# Patient Record
Sex: Female | Born: 2000 | Race: White | Hispanic: No | Marital: Single
Health system: Southern US, Community
[De-identification: ages and names within clinical notes are randomized; demographics above are authoritative.]

## PROBLEM LIST (undated history)

## (undated) DIAGNOSIS — G44009 Cluster headache syndrome, unspecified, not intractable: Secondary | ICD-10-CM

---
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
## 2020-12-03 ENCOUNTER — Emergency Department: Payer: Managed Care, Other (non HMO)

## 2020-12-03 ENCOUNTER — Emergency Department
Admission: EM | Admit: 2020-12-03 | Discharge: 2020-12-03 | Disposition: A | Discharge status: Home / Self Care | Payer: Managed Care, Other (non HMO) | Attending: Emergency Medicine | Admitting: Emergency Medicine

## 2020-12-03 ENCOUNTER — Other Ambulatory Visit: Payer: Self-pay

## 2020-12-03 DIAGNOSIS — R4781 Slurred speech: Secondary | ICD-10-CM | POA: Diagnosis not present

## 2020-12-03 DIAGNOSIS — R251 Tremor, unspecified: Secondary | ICD-10-CM | POA: Insufficient documentation

## 2020-12-03 DIAGNOSIS — G43909 Migraine, unspecified, not intractable, without status migrainosus: Secondary | ICD-10-CM | POA: Diagnosis not present

## 2020-12-03 HISTORY — DX: Cluster headache syndrome, unspecified, not intractable: G44.009

## 2020-12-03 LAB — COMPREHENSIVE METABOLIC PANEL
ALT: 13 U/L (ref 0–44)
AST: 17 U/L (ref 15–41)
Albumin: 4.3 g/dL (ref 3.5–5.0)
Alkaline Phosphatase: 60 U/L (ref 38–126)
Anion gap: 11 (ref 5–15)
BUN: 9 mg/dL (ref 6–20)
CO2: 22 mmol/L (ref 22–32)
Calcium: 9.3 mg/dL (ref 8.9–10.3)
Chloride: 105 mmol/L (ref 98–111)
Creatinine, Ser: 0.49 mg/dL (ref 0.44–1.00)
GFR, Estimated: >60 mL/min (ref >60)
Glucose, Bld: 90 mg/dL (ref 70–99)
Potassium: 3.9 mmol/L (ref 3.5–5.1)
Sodium: 138 mmol/L (ref 135–145)
Total Bilirubin: 0.7 mg/dL (ref 0.3–1.2)
Total Protein: 7.9 g/dL (ref 6.5–8.1)

## 2020-12-03 LAB — CBC
HCT: 39.2 % (ref 36.0–46.0)
Hemoglobin: 13.1 g/dL (ref 12.0–15.0)
MCH: 31.6 pg (ref 26.0–34.0)
MCHC: 33.4 g/dL (ref 30.0–36.0)
MCV: 94.7 fL (ref 80.0–100.0)
Platelets: 267 10*3/uL (ref 150–400)
RBC: 4.14 MIL/uL (ref 3.87–5.11)
RDW: 12.1 % (ref 11.5–15.5)
WBC: 7.3 10*3/uL (ref 4.0–10.5)
nRBC: 0 % (ref 0.0–0.2)

## 2020-12-03 LAB — PROTIME-INR
INR: 0.9 (ref 0.8–1.2)
Prothrombin Time: 12.2 seconds (ref 11.4–15.2)

## 2020-12-03 LAB — DIFFERENTIAL
Abs Immature Granulocytes: 0.03 10*3/uL (ref 0.00–0.07)
Basophils Absolute: 0.1 10*3/uL (ref 0.0–0.1)
Basophils Relative: 1 %
Eosinophils Absolute: 0.1 10*3/uL (ref 0.0–0.5)
Eosinophils Relative: 1 %
Immature Granulocytes: 0 %
Lymphocytes Relative: 16 %
Lymphs Abs: 1.2 10*3/uL (ref 0.7–4.0)
Monocytes Absolute: 0.7 10*3/uL (ref 0.1–1.0)
Monocytes Relative: 9 %
Neutro Abs: 5.3 10*3/uL (ref 1.7–7.7)
Neutrophils Relative %: 73 %

## 2020-12-03 LAB — POC URINE PREG, ED: Preg Test, Ur: NEGATIVE

## 2020-12-03 LAB — APTT: aPTT: 25 seconds (ref 24–36)

## 2020-12-03 LAB — CBG MONITORING, ED: Glucose-Capillary: 78 mg/dL (ref 70–99)

## 2020-12-03 MED ORDER — PROCHLORPERAZINE EDISYLATE 10 MG/2ML IJ SOLN
10.0000 mg | Freq: Once | INTRAMUSCULAR | Status: AC
  Administered 2020-12-03: 10 mg via INTRAVENOUS
  Filled 2020-12-03: qty 2

## 2020-12-03 MED ORDER — DIPHENHYDRAMINE HCL 50 MG/ML IJ SOLN
25.0000 mg | Freq: Once | INTRAMUSCULAR | Status: AC
  Administered 2020-12-03: 25 mg via INTRAVENOUS
  Filled 2020-12-03: qty 1

## 2020-12-03 MED ORDER — LORAZEPAM 2 MG/ML IJ SOLN: 1.0000 mg | INTRAMUSCULAR | Status: DC | PRN

## 2020-12-03 NOTE — ED Notes (Signed)
Pt screened by MRI via phone

## 2020-12-03 NOTE — ED Triage Notes (Signed)
Pt is here to rule out a CVA. Pt has had a hx of compound migraines. Pt states that her symptoms started around 1230 and she was unable to speak or text. Pt right arm shaking uncontrollably in triage. Pt having trouble speaking.

## 2020-12-03 NOTE — ED Provider Notes (Signed)
[facility] Emergency Department Provider Note   ____________________________________________   Event Date/Time   First MD Initiated Contact with Patient 12/03/20 1457     (approximate)  I have reviewed the triage vital signs and the nursing notes.   HISTORY  Chief Complaint Migraine    HPI Alisha Vega is a 20 y.o. female with past medical history of complex migraines who presents to the ED complaining of speech difficulties.  Patient reports that she has been dealing with intermittent headaches for the past couple of days and today around 1230 she developed some difficulty speaking.  She complains of stuttering speech as well as shaking in her right arm.  She also reports intermittent blurry vision along with photophobia, neither of which are present currently.  She has not noticed any facial droop and denies any numbness or weakness in her extremities.  She states she has had neurologic issues with migraines in the past, including back in 2016 when she required MRI.  She does not take any medication chronically for migraine control, reports she had not dealt with them in multiple years since starting birth control.  She has otherwise been feeling well with no fevers, cough, chest pain, shortness of breath, vomiting, or diarrhea.        Past Medical History:  Diagnosis Date  . Migraine-cluster headache syndrome     There are no problems to display for this patient.   History reviewed. No pertinent surgical history.  Prior to Admission medications   Medication Sig Start Date End Date Taking? Authorizing Provider  LO LOESTRIN FE 1 MG-10 MCG / 10 MCG tablet Take 1 tablet by mouth daily. 11/27/18   [provider]    Allergies Patient has no known allergies.  No family history on file.  Social History    Review of Systems  Constitutional: No fever/chills Eyes: Positive for visual changes. ENT: No sore throat. Cardiovascular: Denies  chest pain. Respiratory: Denies shortness of breath. Gastrointestinal: No abdominal pain.  No nausea, no vomiting.  No diarrhea.  No constipation. Genitourinary: Negative for dysuria. Musculoskeletal: Negative for back pain. Skin: Negative for rash. Neurological: Positive for headaches and speech difficulty, negative for focal weakness or numbness.  ____________________________________________   PHYSICAL EXAM:  VITAL SIGNS: ED Triage Vitals  Enc Vitals Group     BP 12/03/20 1409 (!) 139/92     Pulse Rate 12/03/20 1409 94     Resp 12/03/20 1409 18     Temp 12/03/20 1409 99.3 F (37.4 C)     Temp Source 12/03/20 1409 Oral     SpO2 12/03/20 1409 99 %     Weight 12/03/20 1410 140 lb (63.5 kg)     Height 12/03/20 1410 5\' 7"  (1.702 m)     Head Circumference --      Peak Flow --      Pain Score 12/03/20 1410 0     Pain Loc --      Pain Edu? --      Excl. in GC? --     Constitutional: Alert and oriented. Eyes: Conjunctivae are normal.  Pupils equal round and reactive to light bilaterally, extraocular movements intact. Head: Atraumatic. Nose: No congestion/rhinnorhea. Mouth/Throat: Mucous membranes are moist. Neck: Normal ROM Cardiovascular: Normal rate, regular rhythm. Grossly normal heart sounds. Respiratory: Normal respiratory effort.  No retractions. Lungs CTAB. Gastrointestinal: Soft and nontender. No distention. Genitourinary: deferred Musculoskeletal: No lower extremity tenderness nor edema. Neurologic: Stuttering speech pattern with no word  finding difficulties.  Repetitive shaking in right forearm and wrist that cessates with activity, no gross focal neurologic deficits are appreciated. Skin:  Skin is warm, dry and intact. No rash noted. Psychiatric: Mood and affect are normal. Speech and behavior are normal.  ____________________________________________   LABS (all labs ordered are listed, but only abnormal results are displayed)  Labs Reviewed  PROTIME-INR   APTT  CBC  DIFFERENTIAL  COMPREHENSIVE METABOLIC PANEL  CBG MONITORING, ED  POC URINE PREG, ED   ____________________________________________  EKG  ED ECG REPORT I, Chesley Noon, the attending physician, personally viewed and interpreted this ECG.   Date: 12/03/2020  EKG Time: 15:32  Rate: 70  Rhythm: normal sinus rhythm  Axis: Normal  Intervals:none  ST&T Change: None   PROCEDURES  Procedure(s) performed (including Critical Care):  Procedures   ____________________________________________   INITIAL IMPRESSION / ASSESSMENT AND PLAN / ED COURSE       20 year old female with past medical history of complex migraines who presents to the ED with stuttering speech, right arm shaking, and intermittent visual changes since around 1230 this afternoon.  She reports waxing and waning headache over the past couple of days.  Neurologic exam does not appear consistent with stroke and case discussed with Dr. Derry Lory of neurology who agrees etiology is likely complex migraine versus functional.  CT head reviewed by me and shows no obvious hemorrhage, negative for acute process per radiology.  Lab work is unremarkable.  We will treat with migraine cocktail and check MRI as recommended by neurology.  Patient had resolution of neurologic symptoms very shortly after administration of Compazine and Benadryl.  She is now speaking normally with resolution of right arm tremor, no focal deficits noted on reassessment.  MRI performed and negative for acute process.  Patient is appropriate for discharge home with symptoms attributable to complex migraine.  She was counseled to follow-up with neurology and otherwise return to the ED for any new or worsening symptoms, patient agrees with plan.      ____________________________________________   FINAL CLINICAL IMPRESSION(S) / ED DIAGNOSES  Final diagnoses:  Migraine without status migrainosus, not intractable, unspecified migraine type   Slurred speech     ED Discharge Orders    None       Note:  This document was prepared using Dragon voice recognition software and may include unintentional dictation errors.   Chesley Noon, MD 12/03/20 [phone]

## 2021-05-30 IMAGING — MR MR HEAD W/O CM
11 series · 44 of 48 positions shown · non-contrast
Comparison: Noncontrast head CT 12/03/2020.

CLINICAL DATA: Neuro deficit, acute, stroke suspected. Additional
history obtained from electronic medical record: Patient with
history of migraines.

EXAM:
MRI HEAD WITHOUT CONTRAST
TECHNIQUE: Multiplanar, multiecho pulse sequences of the brain and surrounding
structures were obtained without intravenous contrast.

[Series 5: ax dwi_tracew · axial · 3.0mm · 0.60mm/px · z∈[-102,+49]mm · 4 of 48 slices shown]
[im 1/48]
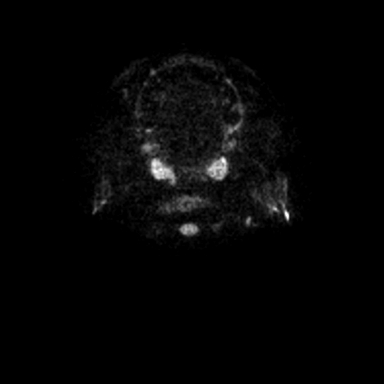
[im 16/48]
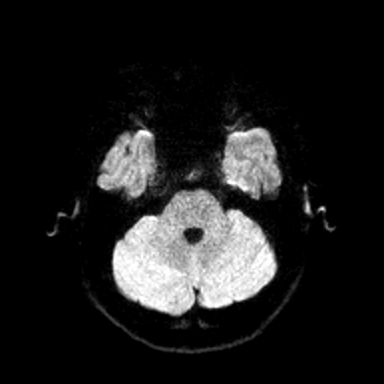
[im 32/48]
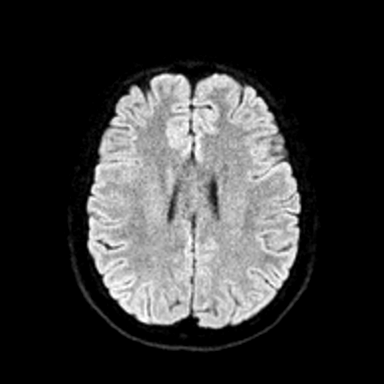
[im 48/48]
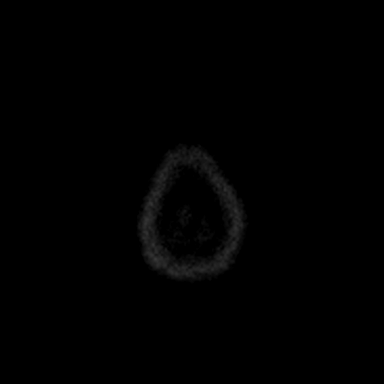

[Series 6: ax dwi_adc · axial · 3.0mm · 0.60mm/px · z∈[-102,+49]mm · 4 of 48 slices shown]
[im 1/48]
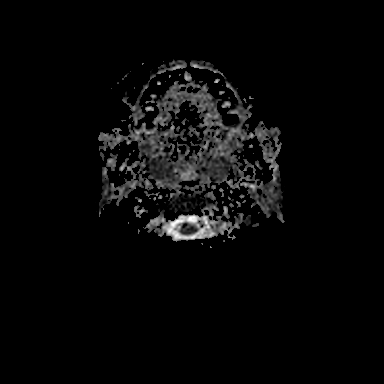
[im 16/48]
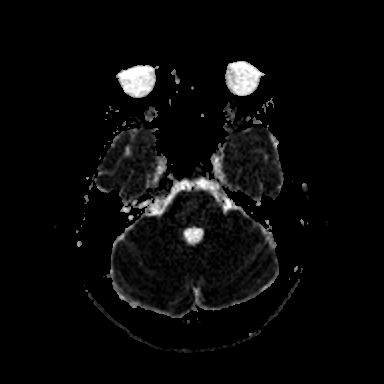
[im 32/48]
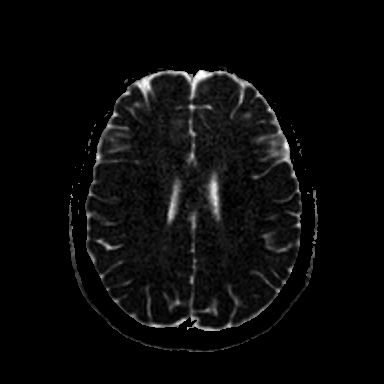
[im 48/48]
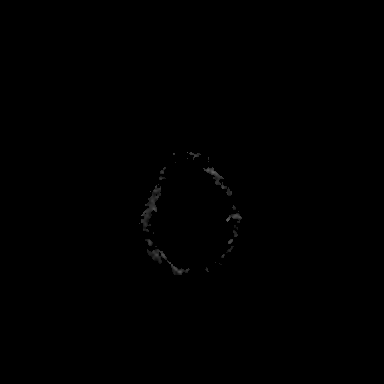

[Series 7: cor dwi_tracew · coronal · 5.0mm · 0.60mm/px · 3 of 40 slices shown]
[im 1/40]
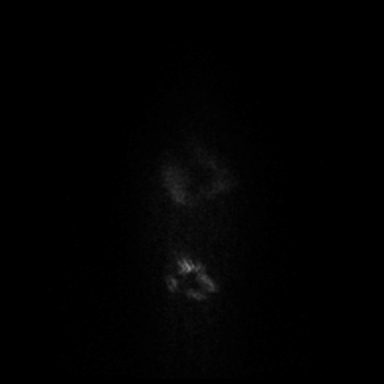
[im 20/40]
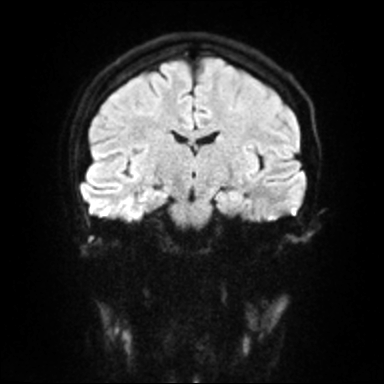
[im 40/40]
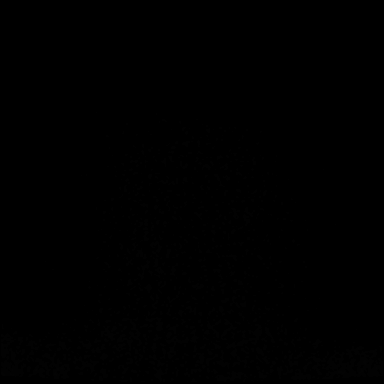

[Series 8: cor dwi_adc · coronal · 5.0mm · 0.60mm/px · 3 of 39 slices shown]
[im 1/39]
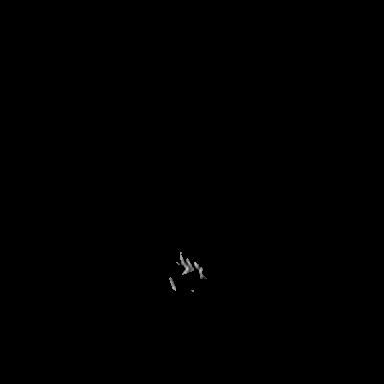
[im 20/39]
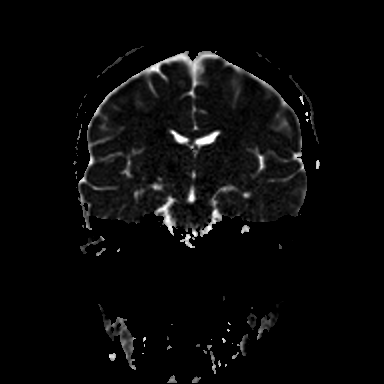
[im 39/39]
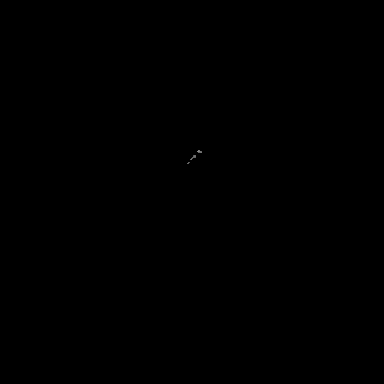

[Series 9: T1 · sagittal · 5.0mm · 0.62mm/px · 2 of 25 slices shown (1 of 2)]
[im 1/25]
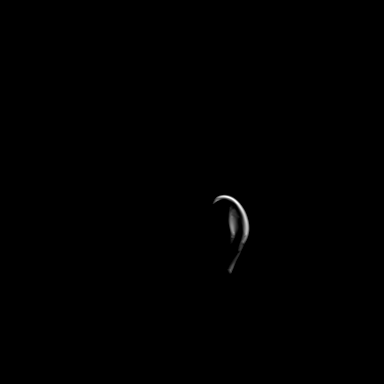
[im 25/25]
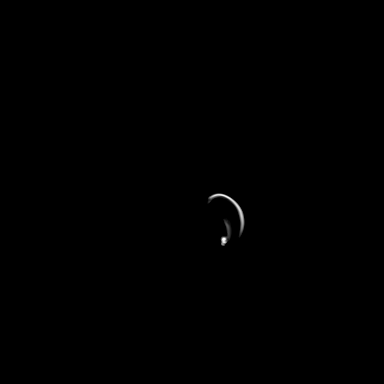

[Series 10: T2 · axial · 5.0mm · 0.53mm/px · z∈[-96,+45]mm · 2 of 25 slices shown (1 of 2)]
[im 1/25]
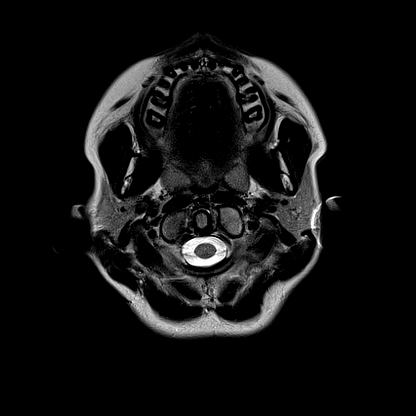
[im 25/25]
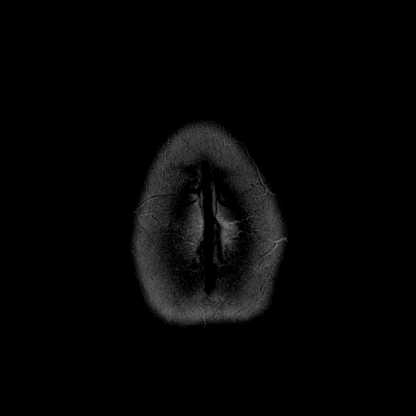

[Series 12: pha_images · axial · 3.0mm · 0.90mm/px · z∈[-112,+57]mm · 5 of 59 slices shown]
[im 1/59]
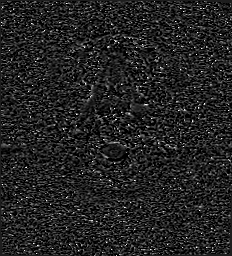
[im 15/59]
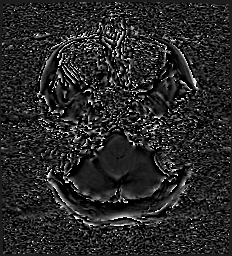
[im 30/59]
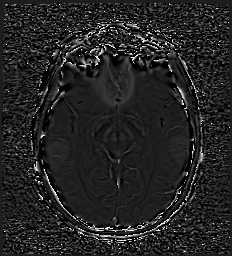
[im 44/59]
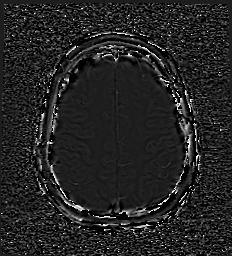
[im 59/59]
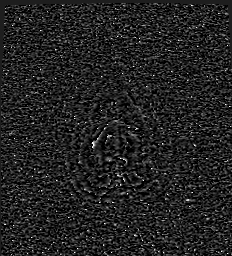

[Series 13: swi_images · axial · 3.0mm · 0.90mm/px · z∈[-112,+60]mm · 5 of 60 slices shown]
[im 1/60]
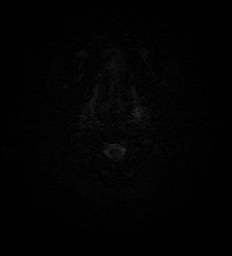
[im 15/60]
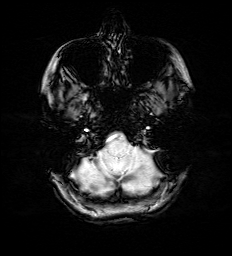
[im 30/60]
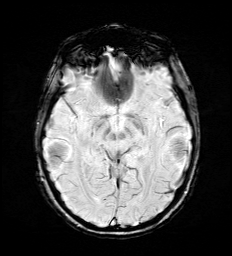
[im 45/60]
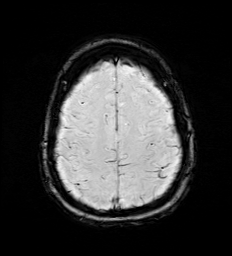
[im 60/60]
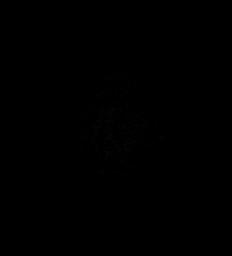

[Series 15: FLAIR · axial · 3.0mm · 0.53mm/px · z∈[-104,+53]mm · 4 of 55 slices shown]
[im 1/55]
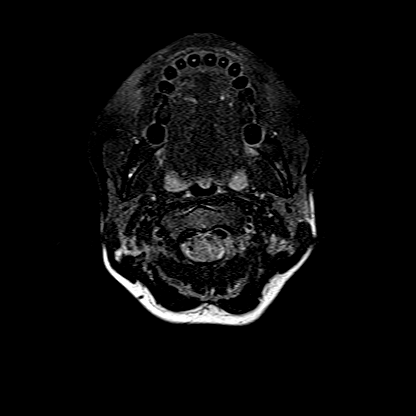
[im 19/55]
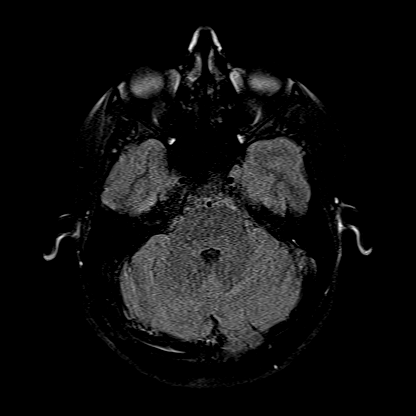
[im 37/55]
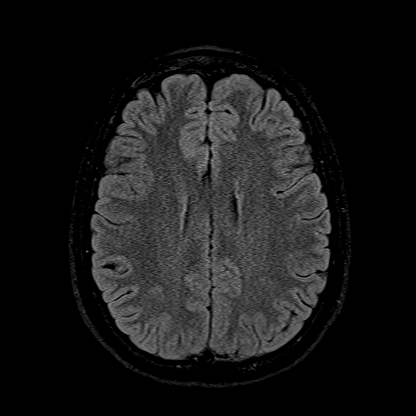
[im 55/55]
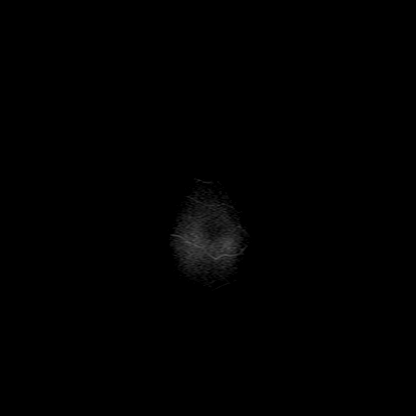

[Series 16: T1 · axial · 1.0mm · 0.98mm/px · z∈[-114,+56]mm · 10 of 176 slices shown (2 of 2)]
[im 1/176]
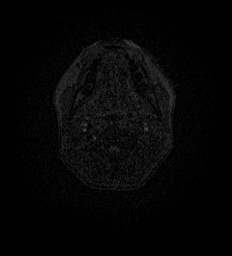
[im 14/176]
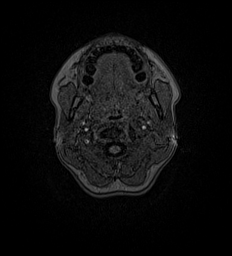
[im 27/176]
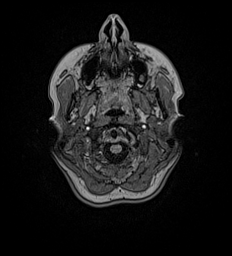
[im 41/176]
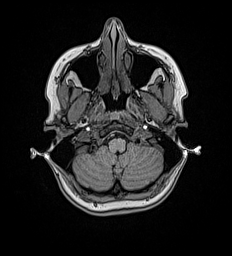
[im 54/176]
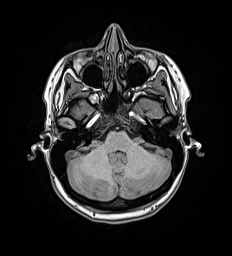
[im 81/176]
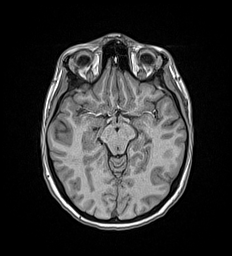
[im 95/176]
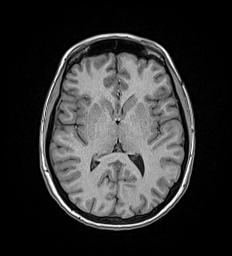
[im 122/176]
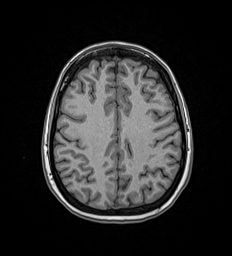
[im 149/176]
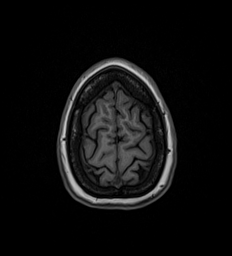
[im 176/176]
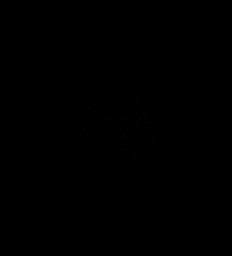

[Series 17: T2 · coronal · 5.0mm · 0.57mm/px · 2 of 29 slices shown (2 of 2)]
[im 1/29]
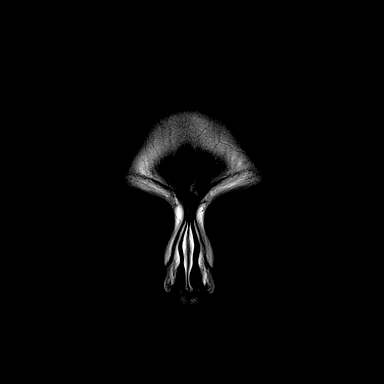
[im 29/29]
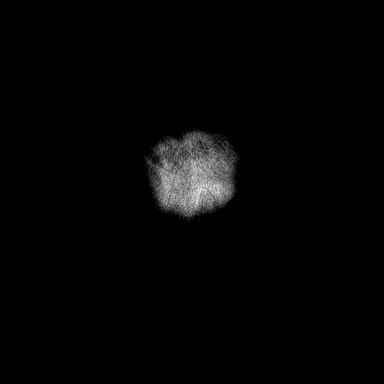

[44 of 48 positions shown; findings below may reference images not displayed]

FINDINGS: Brain:

Cerebral volume is normal.

No focal parenchymal signal abnormality is identified.

There is no acute infarct.

No evidence of intracranial mass.

No chronic intracranial blood products.

No extra-axial fluid collection.

No midline shift.

Vascular: Expected proximal arterial flow voids.

Skull and upper cervical spine: No focal marrow lesion.

Sinuses/Orbits: Visualized orbits show no acute finding. Trace
ethmoid sinus mucosal thickening. Small bilateral maxillary sinus
mucous retention cyst.
IMPRESSION: Unremarkable non-contrast MRI appearance of the brain.

No evidence of acute intracranial abnormality, including acute
infarction.

Minimal bilateral ethmoid sinus mucosal thickening. Small bilateral
maxillary sinus mucous retention cysts.
